# Patient Record
Sex: Male | Born: 1980 | Race: White | Hispanic: No | Marital: Married | Smoking: Never smoker
Health system: Southern US, Community
[De-identification: ages and names within clinical notes are randomized; demographics above are authoritative.]

---

## 2004-04-03 ENCOUNTER — Encounter: Admission: RE | Admit: 2004-04-03 | Discharge: 2004-04-03 | Payer: Self-pay | Admitting: Internal Medicine

## 2005-09-01 IMAGING — US US SCROTUM
1 series · 14 of 25 positions shown · non-contrast
Comparison: none

CLINICAL DATA: Painful enlargement of left testicle.
 ULTRASOUND OF SCROTUM ? 04/03/04 
 No comparison.  The bilateral testes are normal in size with symmetrical internal blood flow and no focal lesion.   The right testis measures 5.2 cm long by 2.5 cm AP by 2.6 cm wide, and the left 5 cm long by 2.5 cm AP by 3.3 cm wide.  The left epididymal head demonstrates a benign appearing cyst/spermatocele measuring 9 mm long by 6 mm AP by 6 mm wide. Two small benign appearing cystic foci are seen at the right epididymal head with incidental spermatoceles measuring 2 mm, respectively.  Scrotal fluid is upper limits of normal with no definite hydrocele.  No varicocele is seen.
 IMPRESSION
 1.  Left greater than right benign appearing epididymal head cysts/spermatoceles.
 2.  Otherwise negative.

[Series 1: unknown · 0.09mm/px · 14 of 47 slices shown]
[im 1/47]
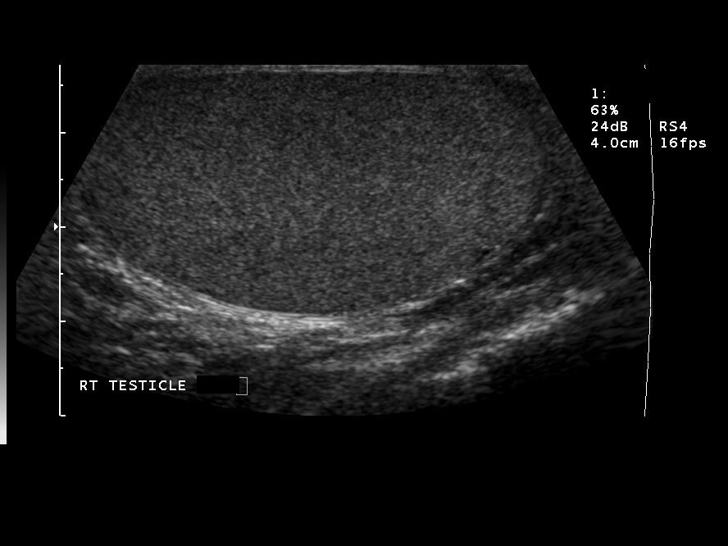
[im 4/47]
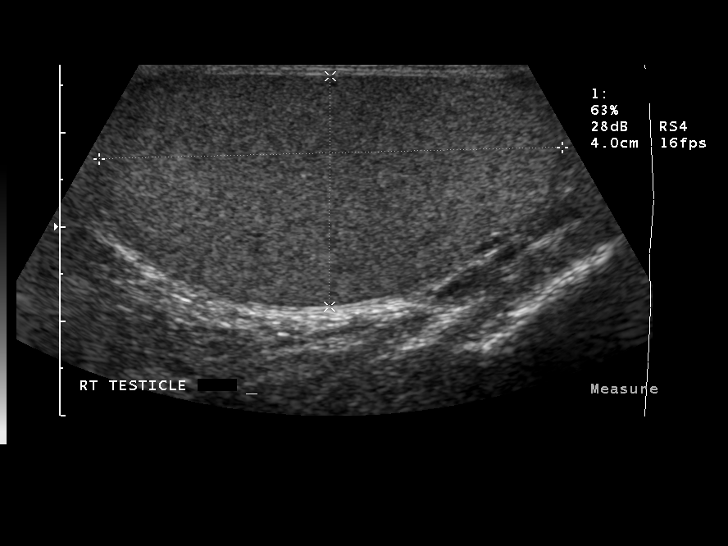
[im 8/47]
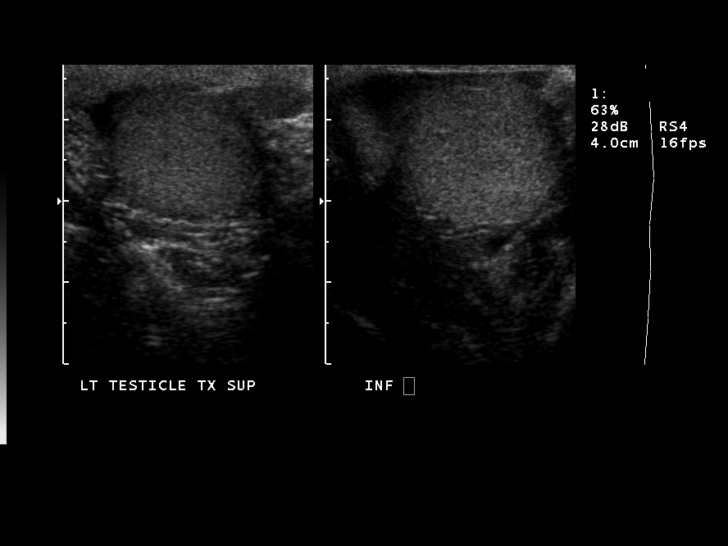
[im 12/47]
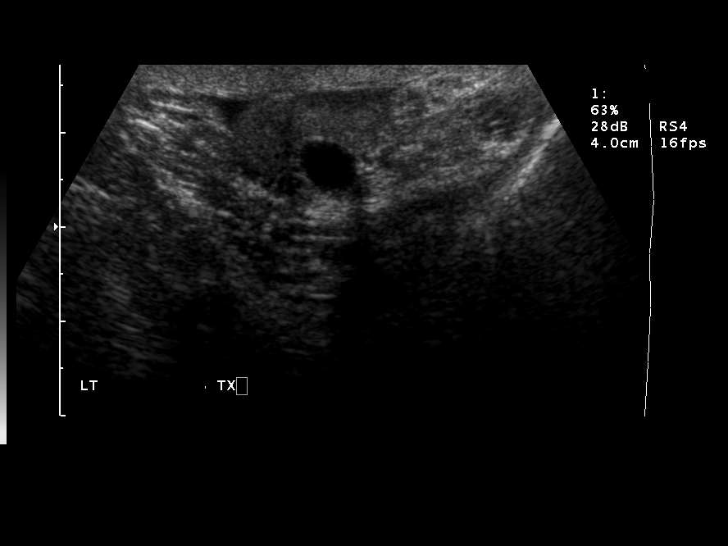
[im 16/47]
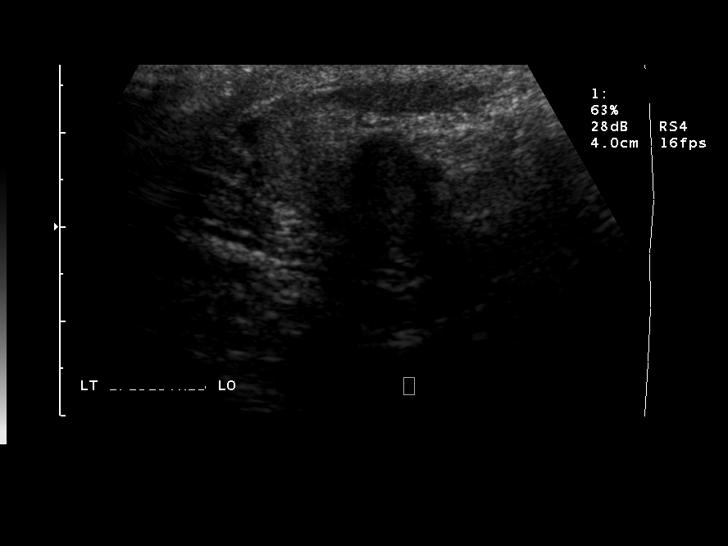
[im 18/47]
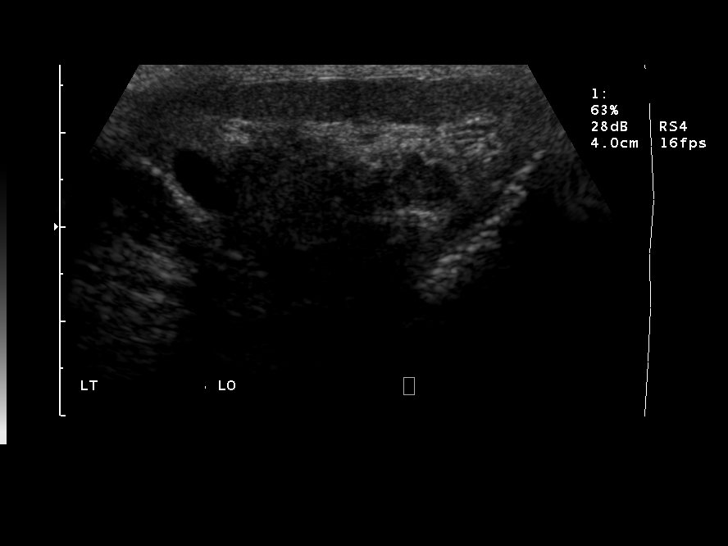
[im 22/47]
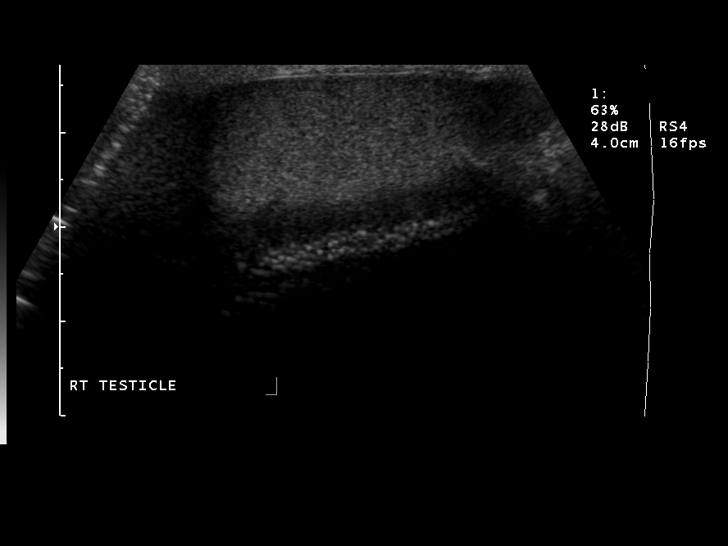
[im 25/47]
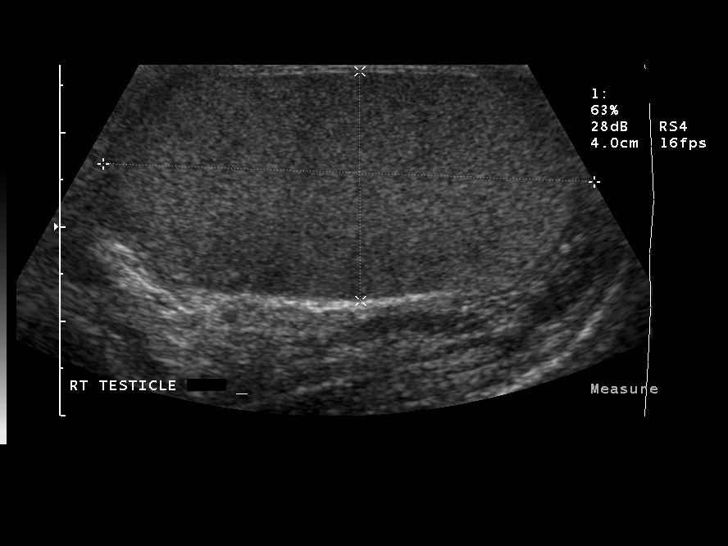
[im 29/47]
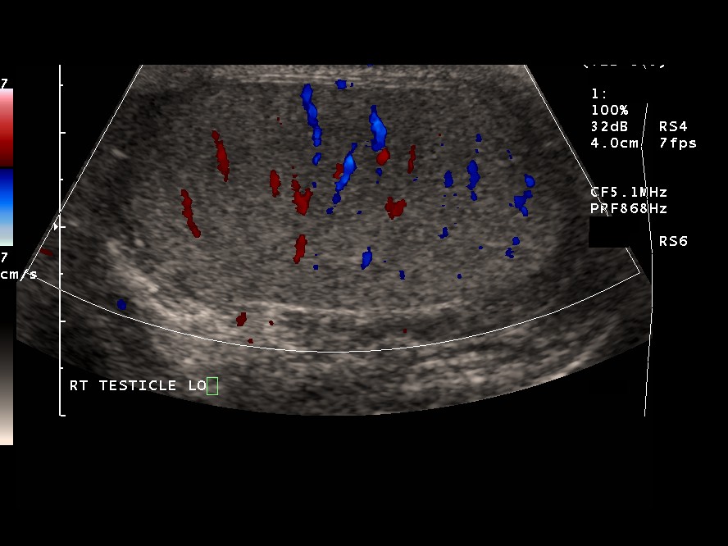
[im 31/47]
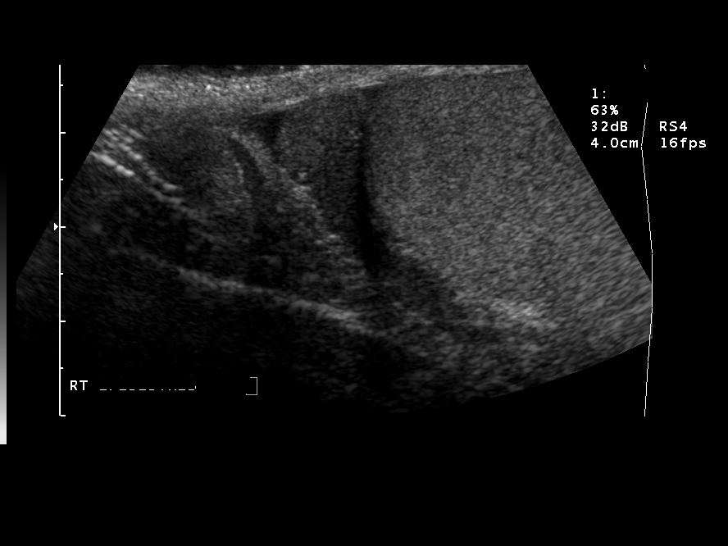
[im 35/47]
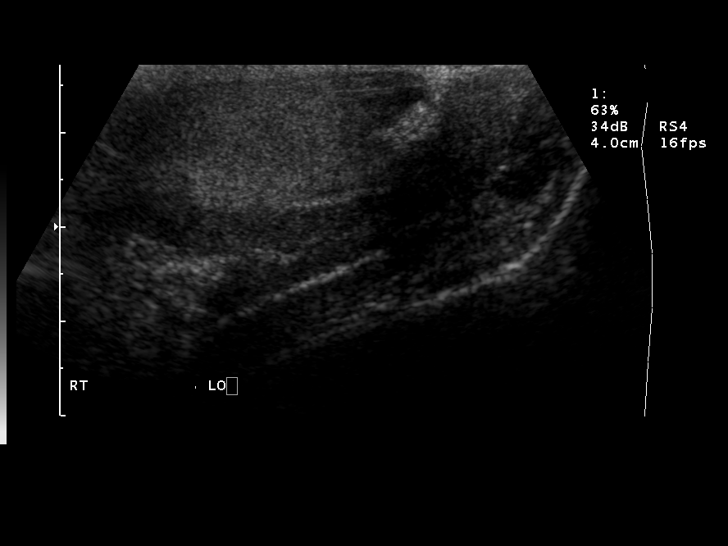
[im 39/47]
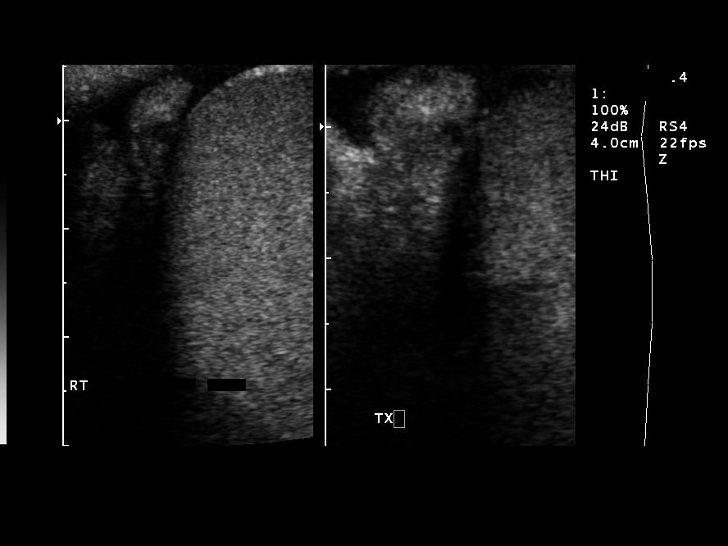
[im 43/47]
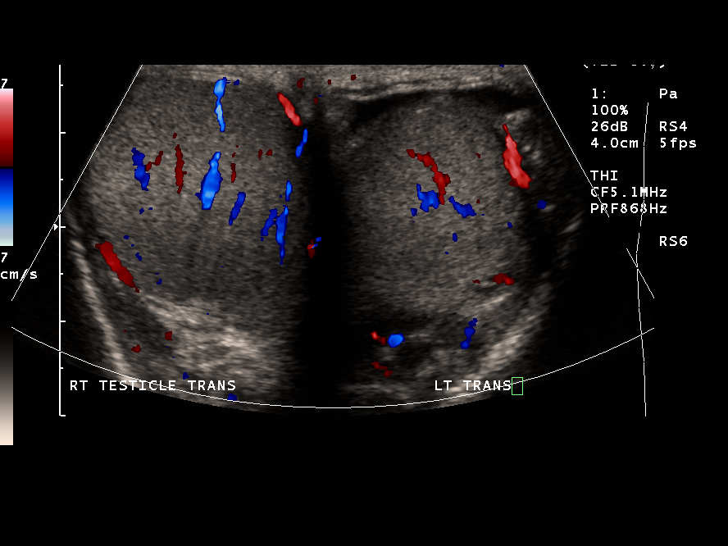
[im 47/47]
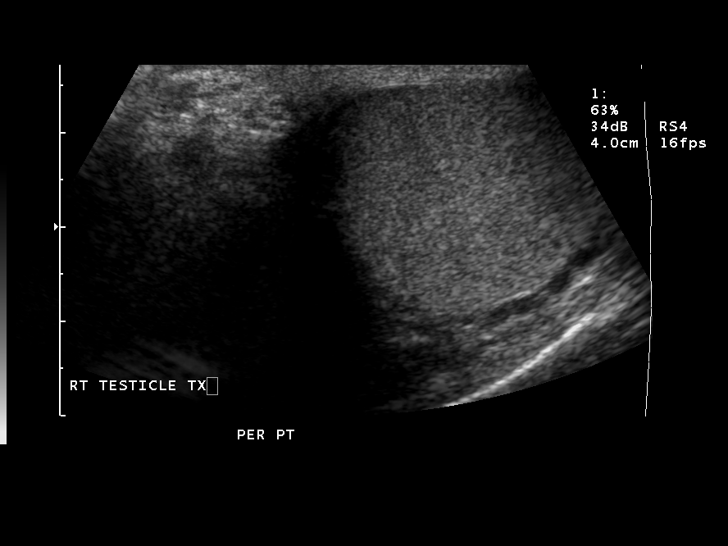

[14 of 25 positions shown; findings below may reference images not displayed]

## 2010-08-03 ENCOUNTER — Encounter: Payer: Self-pay | Admitting: Internal Medicine

## 2021-10-02 ENCOUNTER — Emergency Department (HOSPITAL_BASED_OUTPATIENT_CLINIC_OR_DEPARTMENT_OTHER): Payer: BC Managed Care – PPO

## 2021-10-02 ENCOUNTER — Encounter (HOSPITAL_BASED_OUTPATIENT_CLINIC_OR_DEPARTMENT_OTHER): Payer: Self-pay | Admitting: Emergency Medicine

## 2021-10-02 ENCOUNTER — Emergency Department (HOSPITAL_BASED_OUTPATIENT_CLINIC_OR_DEPARTMENT_OTHER)
Admission: EM | Admit: 2021-10-02 | Discharge: 2021-10-02 | Disposition: A | Discharge status: Home / Self Care | Payer: BC Managed Care – PPO | Attending: Emergency Medicine | Admitting: Emergency Medicine

## 2021-10-02 ENCOUNTER — Other Ambulatory Visit: Payer: Self-pay

## 2021-10-02 DIAGNOSIS — E876 Hypokalemia: Secondary | ICD-10-CM | POA: Diagnosis not present

## 2021-10-02 DIAGNOSIS — R55 Syncope and collapse: Secondary | ICD-10-CM | POA: Diagnosis not present

## 2021-10-02 DIAGNOSIS — E878 Other disorders of electrolyte and fluid balance, not elsewhere classified: Secondary | ICD-10-CM | POA: Diagnosis not present

## 2021-10-02 DIAGNOSIS — E871 Hypo-osmolality and hyponatremia: Secondary | ICD-10-CM | POA: Insufficient documentation

## 2021-10-02 DIAGNOSIS — R110 Nausea: Secondary | ICD-10-CM | POA: Insufficient documentation

## 2021-10-02 DIAGNOSIS — R42 Dizziness and giddiness: Secondary | ICD-10-CM | POA: Diagnosis not present

## 2021-10-02 LAB — URINALYSIS, ROUTINE W REFLEX MICROSCOPIC
Bilirubin Urine: NEGATIVE
Glucose, UA: NEGATIVE mg/dL
Hgb urine dipstick: NEGATIVE
Ketones, ur: NEGATIVE mg/dL
Leukocytes,Ua: NEGATIVE
Nitrite: NEGATIVE
Protein, ur: NEGATIVE mg/dL
Specific Gravity, Urine: 1.015 (ref 1.005–1.030)
pH: 6.5 (ref 5.0–8.0)

## 2021-10-02 LAB — CBC
HCT: 45 % (ref 39.0–52.0)
Hemoglobin: 15.7 g/dL (ref 13.0–17.0)
MCH: 30.1 pg (ref 26.0–34.0)
MCHC: 34.9 g/dL (ref 30.0–36.0)
MCV: 86.4 fL (ref 80.0–100.0)
Platelets: 196 10*3/uL (ref 150–400)
RBC: 5.21 MIL/uL (ref 4.22–5.81)
RDW: 12.9 % (ref 11.5–15.5)
WBC: 9.7 10*3/uL (ref 4.0–10.5)
nRBC: 0 % (ref 0.0–0.2)

## 2021-10-02 LAB — BASIC METABOLIC PANEL
Anion gap: 9 (ref 5–15)
BUN: 15 mg/dL (ref 6–20)
CO2: 25 mmol/L (ref 22–32)
Calcium: 9.3 mg/dL (ref 8.9–10.3)
Chloride: 97 mmol/L — ABNORMAL LOW (ref 98–111)
Creatinine, Ser: 1.16 mg/dL (ref 0.61–1.24)
GFR, Estimated: >60 mL/min (ref >60)
Glucose, Bld: 106 mg/dL — ABNORMAL HIGH (ref 70–99)
Potassium: 3.4 mmol/L — ABNORMAL LOW (ref 3.5–5.1)
Sodium: 131 mmol/L — ABNORMAL LOW (ref 135–145)

## 2021-10-02 LAB — TROPONIN I (HIGH SENSITIVITY)
Troponin I (High Sensitivity): 4 ng/L (ref <18)
Troponin I (High Sensitivity): 4 ng/L (ref <18)

## 2021-10-02 MED ORDER — LACTATED RINGERS IV BOLUS
1000.0000 mL | Freq: Once | INTRAVENOUS | Status: AC
  Administered 2021-10-02: 1000 mL via INTRAVENOUS

## 2021-10-02 NOTE — Discharge Instructions (Signed)
You are seen in the ER today for your episode of lightheadedness.  Your physical exam, vital signs, blood work, and EKG in the emergency department were very reassuring.  There is no sign of any emergent problem with your heart or lungs at this time.  While the exact cause of your symptoms remains unclear, there does not appear to be any dangerous cause at this time.  Please increase your hydration at home, follow-up closely with your primary care doctor and return to ER if you have any chest pain, difficulty breathing, nausea or vomiting that does not stop, or any other new severe symptom. ?

## 2021-10-02 NOTE — ED Notes (Signed)
Pt transported to xray 

## 2021-10-02 NOTE — ED Triage Notes (Signed)
Chest tightness, lightheaded, nausea, and dry mouth since 3 pm today. Was seen at PCP, they told him to come here due to abnormal EKG. Denies pain, shob, vomiting. ?

## 2021-10-02 NOTE — ED Provider Notes (Signed)
?[facility] EMERGENCY DEPARTMENT ?Provider Note ? ? ?CSN: [id number] ?Arrival date & time: 10/02/21  1724 ? ?  ? ?History ? ?Chief Complaint  ?Patient presents with  ? Nausea  ? Near Syncope  ? ? ?Christopher Brady is a 41 y.o. male who presents on referral from his PCP for concern for abnormal EKG.  ? ?  He states that around 3:00 this afternoon he was at work when he suddenly felt lightheaded, nauseous, chest tightness, and panicky.  He states that he left work and went home where he had his wife drive him to his PCP.  There he states he began to feel better but was directed to the ED given concern for abnormalities on his EKG.  He states that his CBG at the office was 122. ? ?Patient does state that he ate less than he normally does today.  States he only had a salad and a protein bar for the entire day until 3 PM.  He denies any chest pain throughout the day or shortness of breath.  Denies palpitations or near syncope.  Denies dizziness like the room spinning or blurry or double vision. ? ?I have personally reviewed this patient's medical records.  He has history of hyperlipidemia improved with lifestyle modifications.  Not currently on any medications daily.  Follows annually with his PCP. ? ?HPI ? ?  ? ?Home Medications ?Prior to Admission medications   ?Not on File  ?   ? ?Allergies    ?Patient has no known allergies.   ? ?Review of Systems   ?Review of Systems  ?Constitutional: Negative.   ?HENT: Negative.    ?Eyes: Negative.   ?Respiratory:  Positive for chest tightness. Negative for shortness of breath.   ?Cardiovascular: Negative.   ?Gastrointestinal:  Positive for nausea. Negative for abdominal pain, diarrhea and vomiting.  ?Genitourinary: Negative.   ?Musculoskeletal: Negative.   ?Skin: Negative.   ?Neurological:  Positive for light-headedness. Negative for dizziness, tremors, seizures, syncope, facial asymmetry, speech difficulty, weakness and headaches.  ?Psychiatric/Behavioral:  The patient is  nervous/anxious.   ? ?Physical Exam ?Updated Vital Signs ?Ht 5\' 7"  (1.702 m)   Wt 74.4 kg   BMI 25.69 kg/m?  ?Physical Exam ?Vitals and nursing note reviewed.  ?Constitutional:   ?   Appearance: He is not ill-appearing or toxic-appearing.  ?HENT:  ?   Head: Normocephalic and atraumatic.  ?   Nose: Nose normal.  ?   Mouth/Throat:  ?   Mouth: Mucous membranes are moist.  ?   Pharynx: Oropharynx is clear. Uvula midline. No oropharyngeal exudate or posterior oropharyngeal erythema.  ?   Tonsils: No tonsillar exudate.  ?Eyes:  ?   General: Lids are normal. Vision grossly intact.     ?   Right eye: No discharge.     ?   Left eye: No discharge.  ?   Extraocular Movements: Extraocular movements intact.  ?   Conjunctiva/sclera: Conjunctivae normal.  ?   Pupils: Pupils are equal, round, and reactive to light.  ?Neck:  ?   Trachea: Trachea and phonation normal.  ?   Meningeal: Brudzinski's sign and Kernig's sign absent.  ?Cardiovascular:  ?   Rate and Rhythm: Normal rate and regular rhythm.  ?   Pulses: Normal pulses.     ?     Radial pulses are 2+ on the right side and 2+ on the left side.  ?     Dorsalis pedis pulses are 2+ on the  right side and 2+ on the left side.  ?   Heart sounds: Normal heart sounds. No murmur heard. ?Pulmonary:  ?   Effort: Pulmonary effort is normal. No tachypnea, bradypnea, accessory muscle usage, prolonged expiration or respiratory distress.  ?   Breath sounds: Normal breath sounds. No wheezing or rales.  ?Chest:  ?   Chest wall: No mass, lacerations, deformity, swelling, tenderness, crepitus or edema.  ?Abdominal:  ?   General: Bowel sounds are normal. There is no distension.  ?   Palpations: Abdomen is soft.  ?   Tenderness: There is no abdominal tenderness. There is no right CVA tenderness, left CVA tenderness, guarding or rebound.  ?Musculoskeletal:     ?   General: No deformity.  ?   Cervical back: Normal range of motion and neck supple. No tenderness.  ?   Right lower leg: No edema.  ?    Left lower leg: No edema.  ?Lymphadenopathy:  ?   Cervical: No cervical adenopathy.  ?Skin: ?   General: Skin is warm and dry.  ?   Capillary Refill: Capillary refill takes less than 2 seconds.  ?Neurological:  ?   General: No focal deficit present.  ?   Mental Status: He is alert and oriented to person, place, and time. Mental status is at baseline.  ?Psychiatric:     ?   Mood and Affect: Mood normal.  ? ? ?ED Results / Procedures / Treatments   ?Labs ?(all labs ordered are listed, but only abnormal results are displayed) ?Labs Reviewed  ?BASIC METABOLIC PANEL  ?CBC  ?URINALYSIS, ROUTINE W REFLEX MICROSCOPIC  ?TROPONIN I (HIGH SENSITIVITY)  ? ? ?EKG ?None ? ?Radiology ?No results found. ? ?Procedures ?Procedures  ? ?Medications Ordered in ED ?Medications - No data to display ? ?ED Course/ Medical Decision Making/ A&P ?  ?                        ?Medical Decision Making ?41 year old male presents after episode of lightheadedness with abnormal EKG in the outpatient setting. ? ?VS normal on intake. Cardiopulmonary exam is normal, abdominal exam is benign.  Patient is neurovascularly intact in all 4 extremities. ? ?Amount and/or Complexity of Data Reviewed ?Labs: ordered. ?   Details: CBC is without leukocytosis or anemia.  BMP with hyponatremia of 134, mild hypokalemia of 3.4, chloride was low at 97.  UA without evidence of infection. ?Radiology: ordered. ?   Details: Chest x-ray without acute cardiopulmonary disease.  Images visualized by this provider. ?ECG/medicine tests: ordered. ?   Details: EKG with normal sinus rhythm without STEMI.  Patient with NSR on cardiac monitor throughout his stay in the emergency department. ? ? ?Overall physical exam is very reassuring as is work-up.  While the exact etiology of this patient's symptoms remains unclear clinical concern for emergent underlying etiology that would warrant further ED work-up or inpatient management is exceedingly low. ? ?Recommend close outpatient  follow-up with his primary care doctor.  Thereasa DistanceRodney voiced understanding with medical evaluation and treatment plan.  She was questions answered to his expressed infection.  Return precautions given.  Patient is well-appearing, stable, and was discharged in good condition. ? ?This chart was dictated using voice recognition software, Dragon. Despite the best efforts of this provider to proofread and correct errors, errors may still occur which can change documentation meaning. ? ?Final Clinical Impression(s) / ED Diagnoses ?Final diagnoses:  ?None  ? ? ?Rx / DC  Orders ?ED Discharge Orders   ? ? None  ? ?  ? ? ?  ?Paris Lore, PA-C ?10/02/21 2036 ? ?  ?Terrilee Files, MD ?10/03/21 1155 ? ?

## 2023-03-02 IMAGING — CR DG CHEST 2V
2 series · 2 of 2 positions shown · non-contrast
Comparison: None.

CLINICAL DATA: Chest tightness

EXAM:
CHEST - 2 VIEW

[w chest pa]
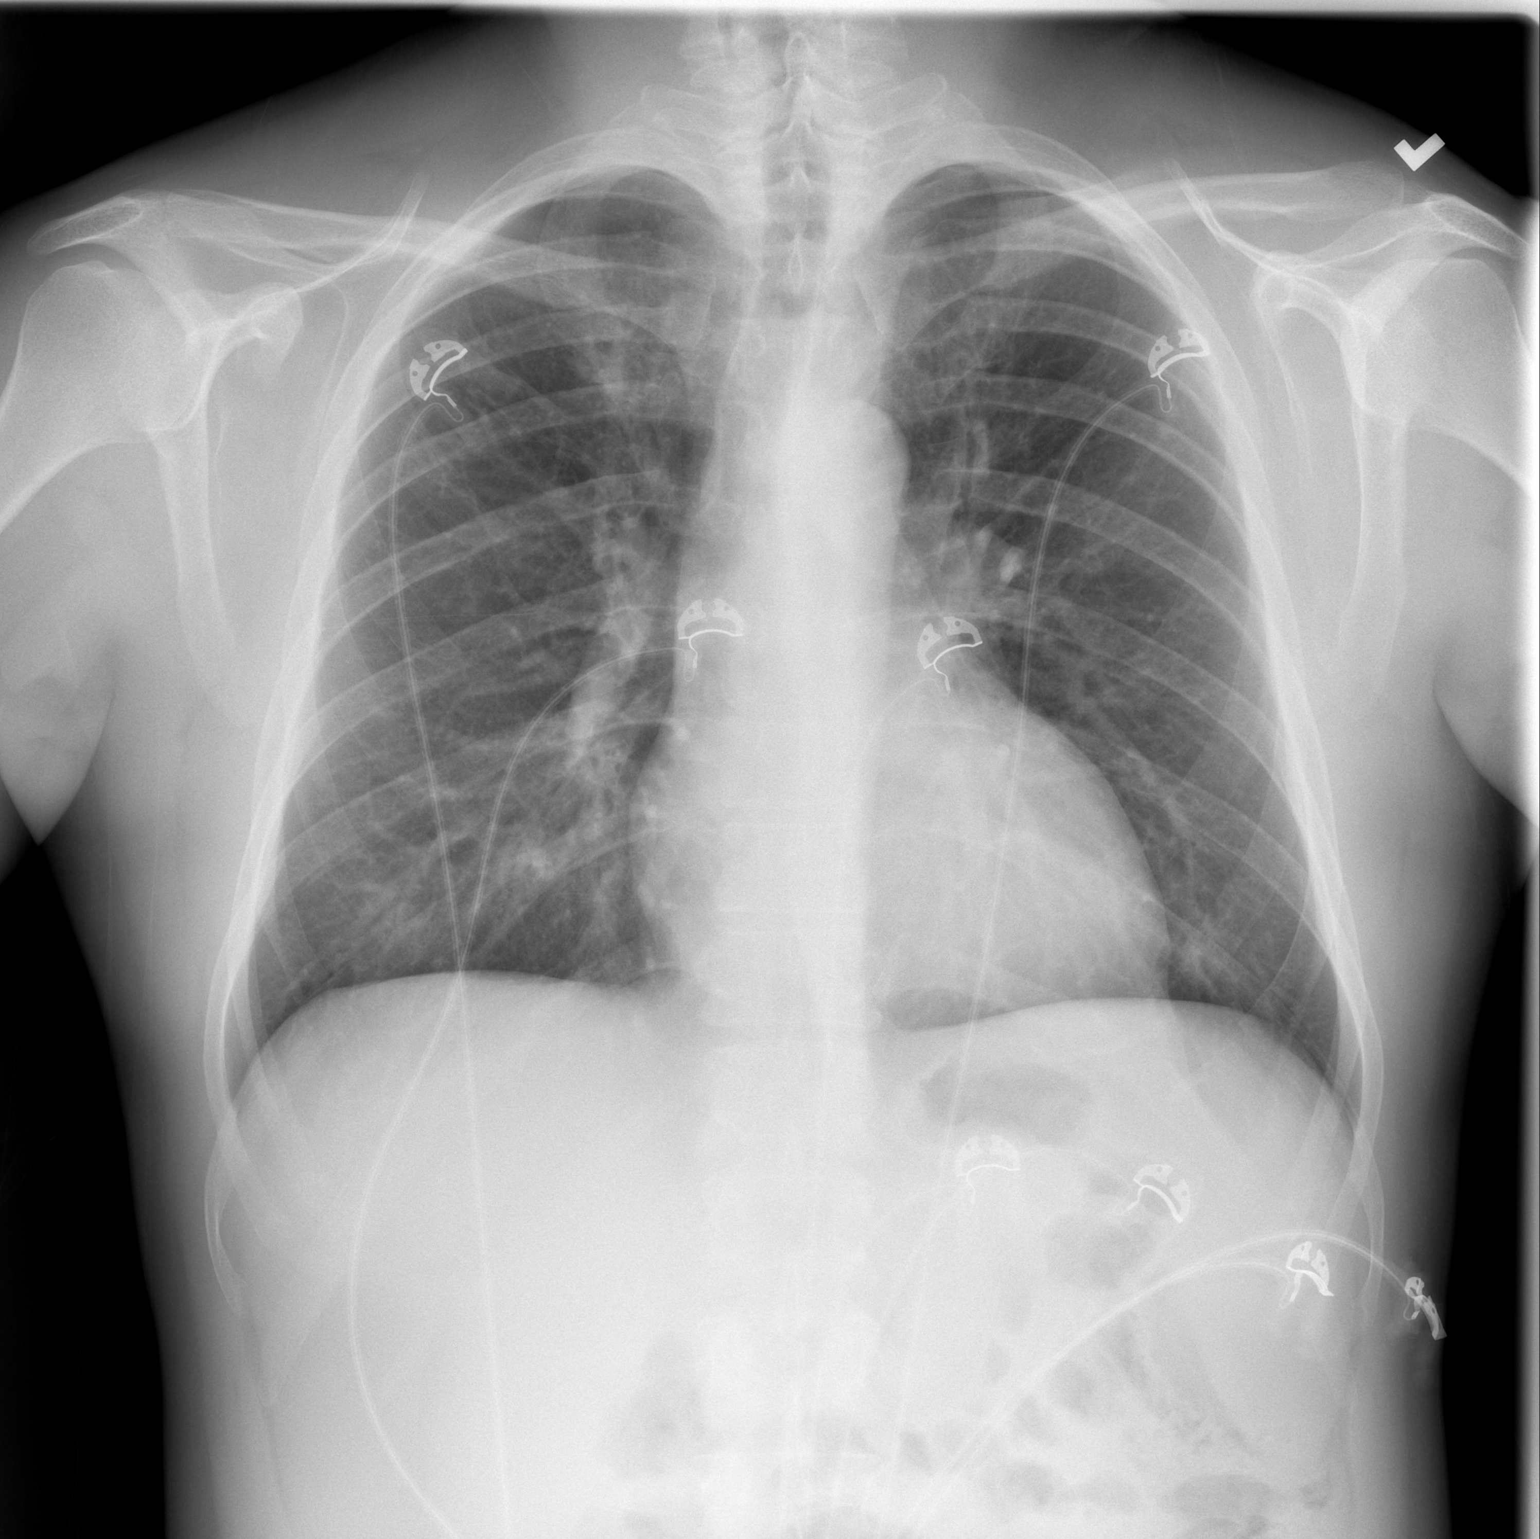

[w chest lat]
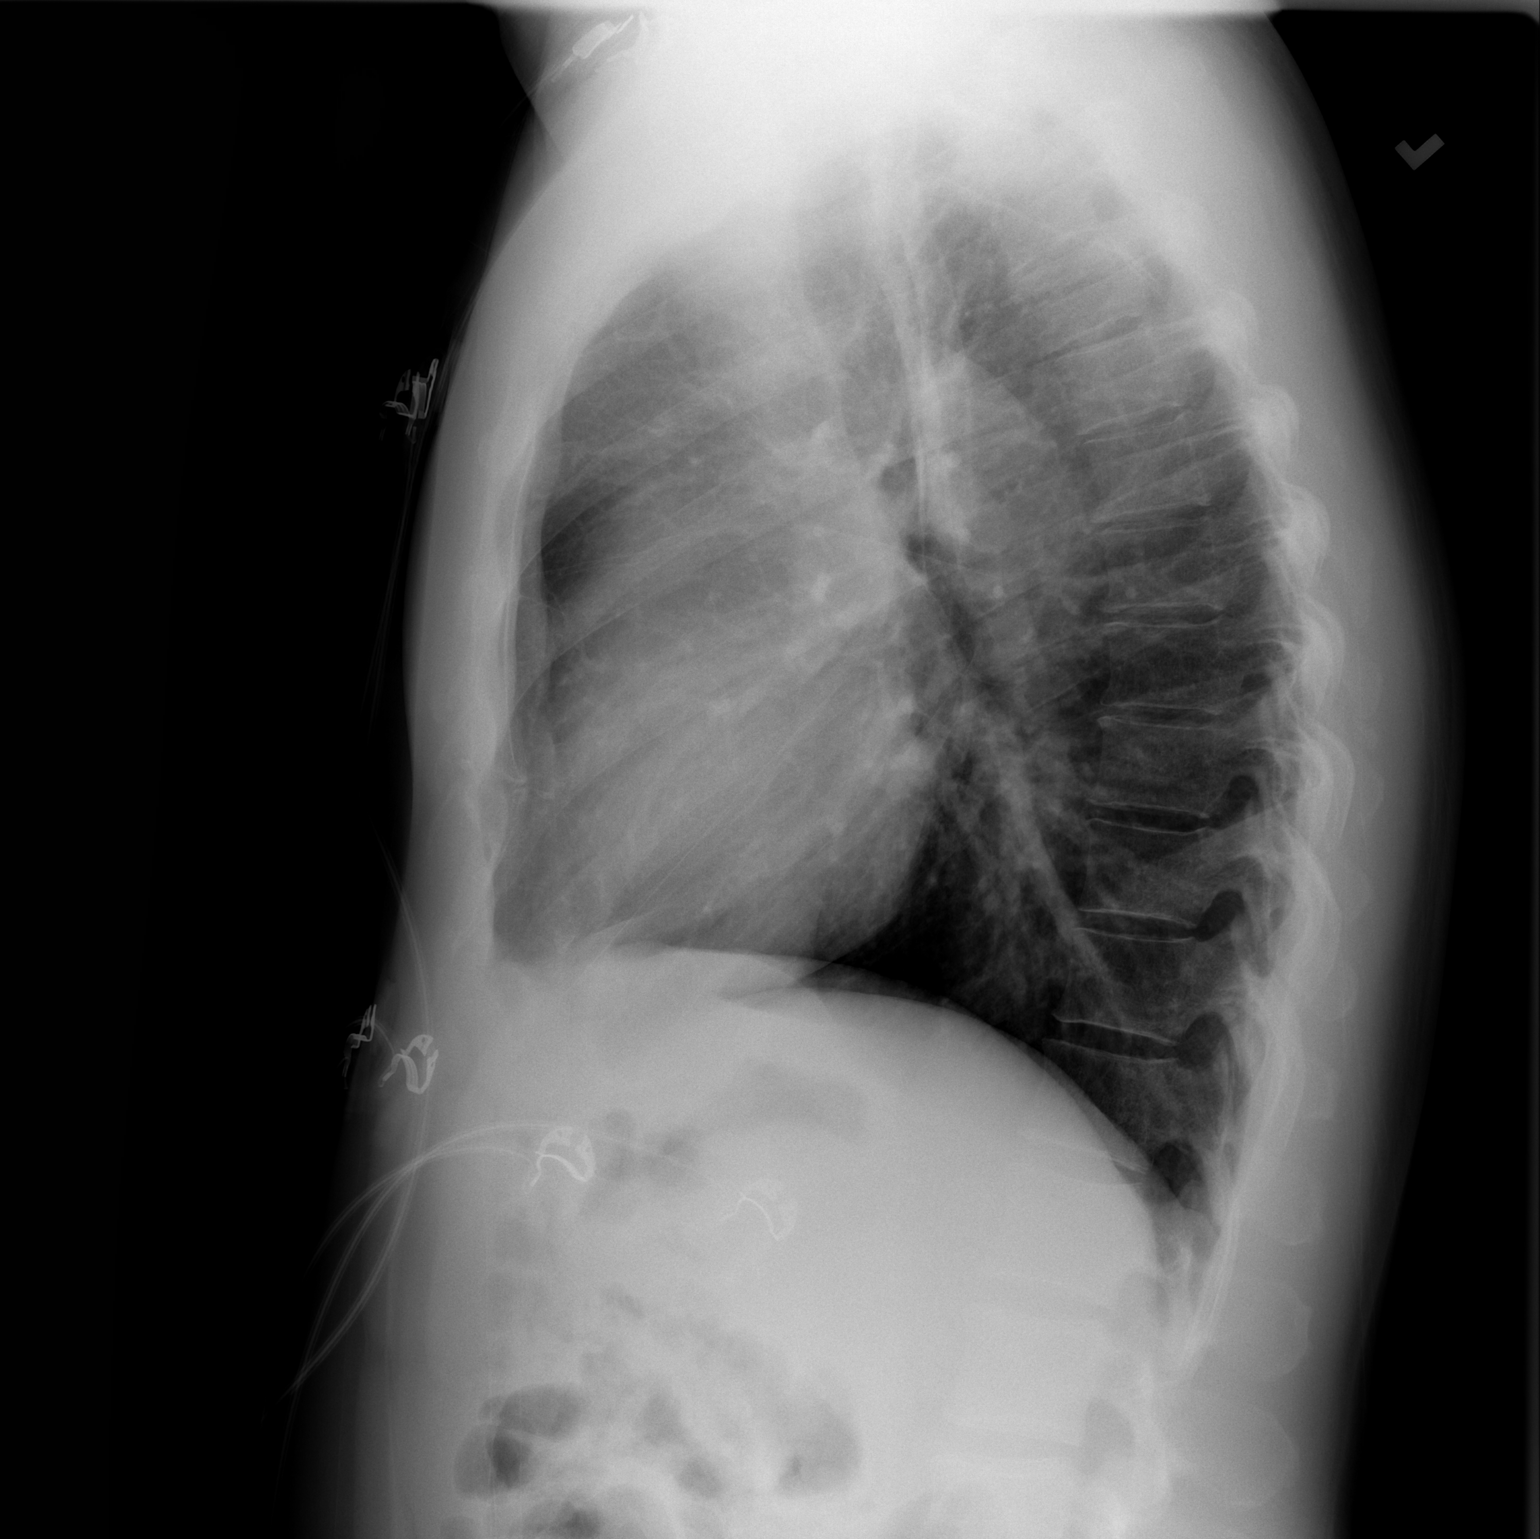

[2 of 2 positions shown; findings below may reference images not displayed]

FINDINGS: The heart size and mediastinal contours are within normal limits.
Both lungs are clear. The visualized skeletal structures are
unremarkable.
IMPRESSION: No evidence of acute cardiopulmonary disease.
